# Patient Record
Sex: Male | Born: 1985 | Race: White | Hispanic: No | Marital: Single | Smoking: Current every day smoker
Health system: Southern US, Community
[De-identification: ages and names within clinical notes are randomized; demographics above are authoritative.]

## PROBLEM LIST (undated history)

## (undated) DIAGNOSIS — K219 Gastro-esophageal reflux disease without esophagitis: Secondary | ICD-10-CM

## (undated) DIAGNOSIS — F419 Anxiety disorder, unspecified: Secondary | ICD-10-CM

## (undated) HISTORY — PX: FOREARM SURGERY: SHX651

---

## 2008-02-15 ENCOUNTER — Emergency Department: Payer: Self-pay | Admitting: Emergency Medicine

## 2008-02-20 ENCOUNTER — Emergency Department: Payer: Self-pay | Admitting: Internal Medicine

## 2009-07-03 IMAGING — CR DG RIBS 2V*R*
1 series · 3 of 3 positions shown · non-contrast
Comparison: none

REASON FOR EXAM: fall/injury    Minor care
COMMENTS:   LMP: (Male)

PROCEDURE:     DXR - DXR RIBS RIGHT UNILATERAL  - February 20, 2008 [DATE]
RESULT:     No fracture or other acute bony abnormality is identified.

[Series 1: view not recorded · 0.17mm/px · 3 of 3 slices shown]
[im 1/3]
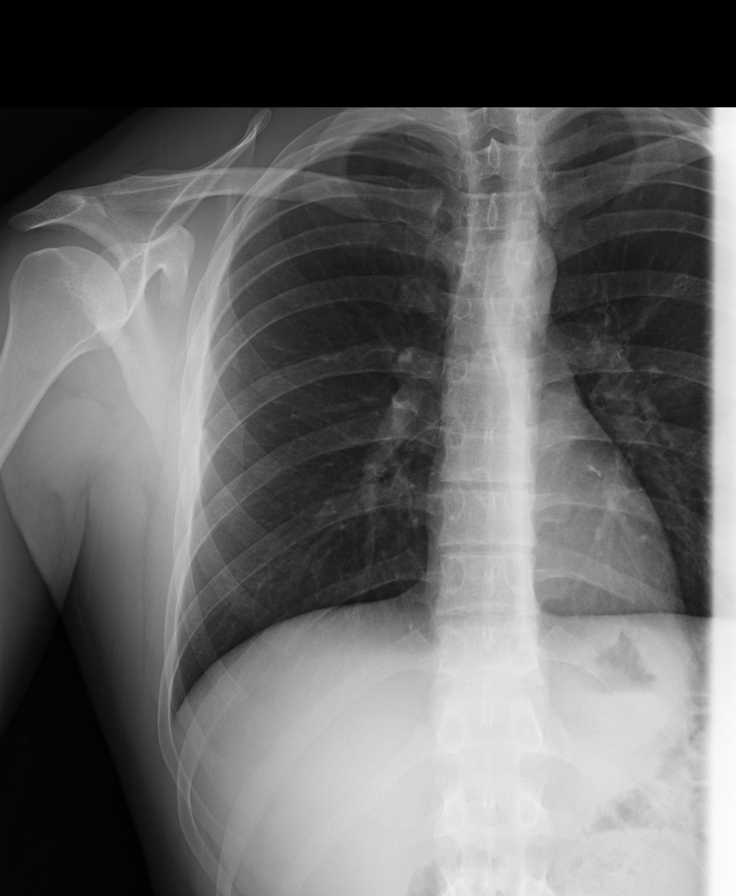
[im 2/3]
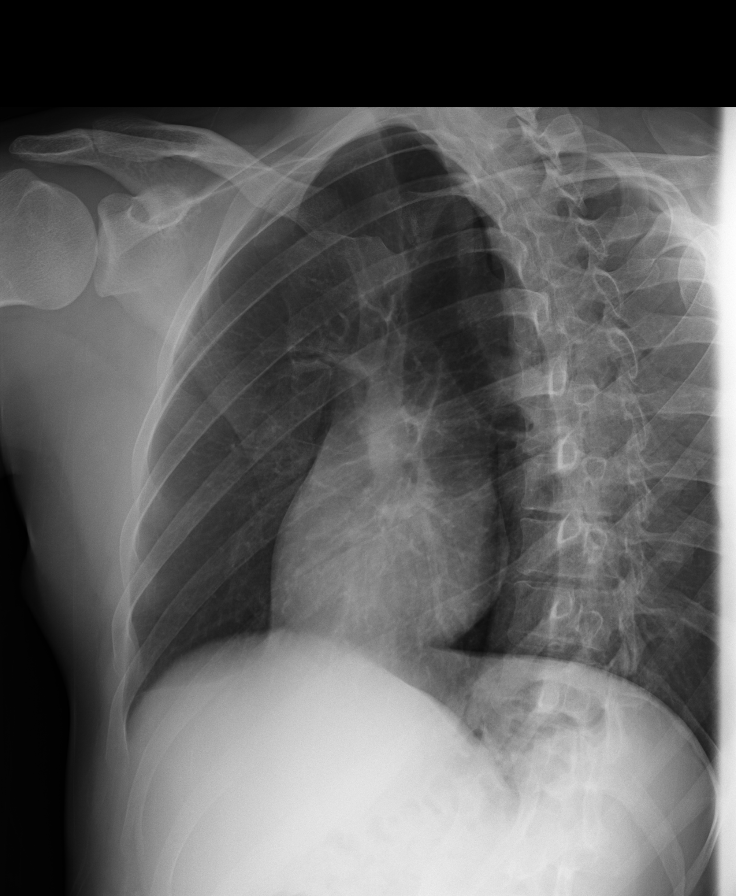
[im 3/3]
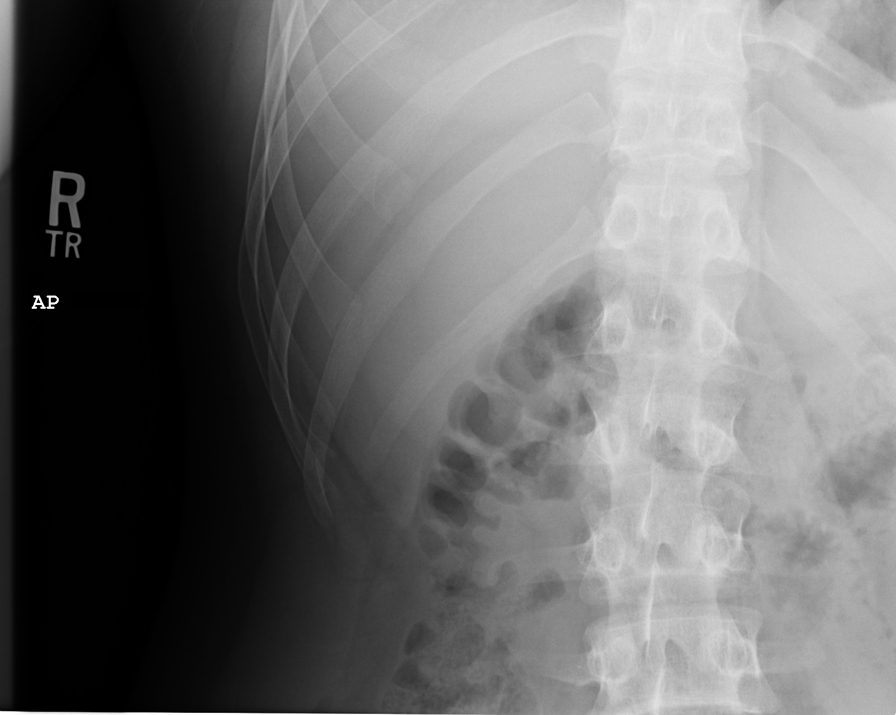

[3 of 3 positions shown; findings below may reference images not displayed]

IMPRESSION: No significant osseous abnormalities are noted.

## 2010-07-11 ENCOUNTER — Emergency Department: Payer: Self-pay | Admitting: Emergency Medicine

## 2012-10-04 ENCOUNTER — Ambulatory Visit: Payer: Self-pay | Admitting: Internal Medicine

## 2012-10-06 LAB — BETA STREP CULTURE(ARMC)

## 2012-11-20 ENCOUNTER — Ambulatory Visit: Payer: Self-pay

## 2012-11-23 ENCOUNTER — Emergency Department: Payer: Self-pay | Admitting: Emergency Medicine

## 2014-06-25 ENCOUNTER — Ambulatory Visit: Payer: Self-pay | Admitting: Emergency Medicine

## 2018-03-13 ENCOUNTER — Other Ambulatory Visit: Payer: Self-pay

## 2018-03-13 ENCOUNTER — Ambulatory Visit
Admission: EM | Admit: 2018-03-13 | Discharge: 2018-03-13 | Disposition: A | Discharge status: Home / Self Care | Payer: BLUE CROSS/BLUE SHIELD | Attending: Family Medicine | Admitting: Family Medicine

## 2018-03-13 DIAGNOSIS — L0501 Pilonidal cyst with abscess: Secondary | ICD-10-CM

## 2018-03-13 MED ORDER — SULFAMETHOXAZOLE-TRIMETHOPRIM 800-160 MG PO TABS: 1.0000 | ORAL_TABLET | Freq: Two times a day (BID) | ORAL | 0 refills | Status: AC

## 2018-03-13 NOTE — ED Provider Notes (Signed)
[facility] ____________________________________________  Time seen: Approximately 3:01 PM  I have reviewed the triage vital signs and the nursing notes.   HISTORY  Chief Complaint Abscess   HPI Johnny Bright is a 32 y.o. 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  Reports of the last 2 weeks she has been feeling tenderness to the area again.  Reports does have some noted scar tissue.  States that the area is mildly tender and somewhat swollen currently.  Denies any drainage.  Denies pain radiation.  Continues to urinate and move bowels normally.  Denies fevers.  No abdominal pain.  States has an appointment in 1 month with surgery for consultation of removal.  Denies other aggravating or alleviating factors. Denies recent sickness. Denies recent antibiotic use.   [facility] [place] [facility]: PCP   History reviewed. No pertinent past medical history.  There are no active problems to display for this patient.   Past Surgical History:  Procedure Laterality Date  . FOREARM SURGERY       No current facility-administered medications for this encounter.   Current Outpatient Medications:  .  citalopram (CELEXA) 20 MG tablet, Take by mouth., Disp: , Rfl:  .  famotidine (PEPCID) 10 MG tablet, Take by mouth., Disp: , Rfl:  .  sulfamethoxazole-trimethoprim (BACTRIM DS,SEPTRA DS) 800-160 MG tablet, Take 1 tablet by mouth 2 (two) times daily for 10 days., Disp: 20 tablet, Rfl: 0  Allergies Patient has no known allergies.  Family History  Problem Relation Age of Onset  . Ovarian cancer Mother   . Hypertension Mother     Social History Social History   Tobacco Use  . Smoking status: Former Games developer  . Smokeless tobacco: Never Used  Substance Use Topics  . Alcohol use: Yes    Comment: occasionally  . Drug use: Not Currently    Review of  Systems Constitutional: No fever/chills Eyes: No visual changes. ENT: No sore throat. Cardiovascular: Denies chest pain. Respiratory: Denies shortness of breath. Gastrointestinal: No abdominal pain.  No nausea, no vomiting.  No diarrhea.  No constipation. Genitourinary: Negative for dysuria. Musculoskeletal: Negative for back pain. Skin: as above. Denies other skin changes. Denies history of MRSA.  ____________________________________________   PHYSICAL EXAM:  VITAL SIGNS: ED Triage Vitals  Enc Vitals Group     BP 03/13/18 1231 108/67     Pulse Rate 03/13/18 1231 68     Resp 03/13/18 1231 18     Temp 03/13/18 1231 98.4 F (36.9 C)     Temp Source 03/13/18 1231 Oral     SpO2 03/13/18 1231 100 %     Weight 03/13/18 1228 155 lb (70.3 kg)     Height 03/13/18 1228 5\' 8"  (1.727 m)     Head Circumference --      Peak Flow --      Pain Score 03/13/18 1228 8     Pain Loc --      Pain Edu? --      Excl. in GC? --     Constitutional: Alert and oriented. Well appearing and in no acute distress. ENT      Head: Normocephalic and atraumatic. Cardiovascular: Normal rate, regular rhythm. Grossly normal heart sounds.  Good peripheral circulation. Respiratory: Normal respiratory effort without tachypnea nor retractions. Breath sounds are clear and equal bilaterally. No wheezes, rales, rhonchi. Musculoskeletal: No lumbar tenderness to palpation.  Neurologic:  Normal speech  and language. Speech is normal. No gait instability.  Skin:  Skin is warm, dry.  Except: Left upper pilonidal abscess noted, previous scar present, approximately 1 x 1.5 cm indurated tender area, nonerythematous, no fluctuance, no drainage, no surrounding erythema, mild tenderness to direct palpation. Psychiatric: Mood and affect are normal. Speech and behavior are normal. Patient exhibits appropriate insight and judgment   ___________________________________________   LABS (all labs ordered are listed, but only  abnormal results are displayed)  Labs Reviewed - No data to display   PROCEDURES Procedures   INITIAL IMPRESSION / ASSESSMENT AND PLAN / ED COURSE  Pertinent labs & imaging results that were available during my care of the patient were reviewed by me and considered in my medical decision making (see chart for details).  Well-appearing patient.  No acute distress.  Patient with a recurrent pilonidal abscess history.  Area is still relatively small without fluctuance.  No clear indication for I&D at this time.  Will treat with oral Bactrim, recommend warm compresses and supportive care, keeping clean.  Follow-up with surgeon as scheduled.Discussed indication, risks and benefits of medications with patient.  Discussed follow up with Primary care physician this week as needed. Discussed follow up and return parameters including no resolution or any worsening concerns. Patient verbalized understanding and agreed to plan.   ____________________________________________   FINAL CLINICAL IMPRESSION(S) / ED DIAGNOSES  Final diagnoses:  Pilonidal abscess     ED Discharge Orders        Ordered    sulfamethoxazole-trimethoprim (BACTRIM DS,SEPTRA DS) 800-160 MG tablet  2 times daily     03/13/18 1346       Note: This dictation was prepared with Dragon dictation along with smaller phrase technology. Any transcriptional errors that result from this process are unintentional.         Renford DillsMiller, Gotti Alwin, NP 03/13/18 1516

## 2018-03-13 NOTE — ED Triage Notes (Signed)
Patient complains of pilonidal cyst that has been flaring off and on for 10 years. Patient states that he is set to have it removed next month. Patient states that area is full of pressure.

## 2018-03-13 NOTE — Discharge Instructions (Addendum)
Take medication as prescribed. Rest. Drink plenty of fluids. Warm compresses.   Keep surgery appointment.   Follow up with your primary care physician this week as needed. Return to Urgent care for new or worsening concerns.

## 2018-10-16 ENCOUNTER — Ambulatory Visit
Admission: EM | Admit: 2018-10-16 | Discharge: 2018-10-16 | Disposition: A | Discharge status: Home / Self Care | Payer: BLUE CROSS/BLUE SHIELD | Attending: Family Medicine | Admitting: Family Medicine

## 2018-10-16 ENCOUNTER — Other Ambulatory Visit: Payer: Self-pay

## 2018-10-16 ENCOUNTER — Encounter: Payer: Self-pay | Admitting: Emergency Medicine

## 2018-10-16 DIAGNOSIS — Z720 Tobacco use: Secondary | ICD-10-CM | POA: Diagnosis not present

## 2018-10-16 DIAGNOSIS — R69 Illness, unspecified: Principal | ICD-10-CM

## 2018-10-16 DIAGNOSIS — R05 Cough: Secondary | ICD-10-CM | POA: Diagnosis not present

## 2018-10-16 DIAGNOSIS — R0981 Nasal congestion: Secondary | ICD-10-CM

## 2018-10-16 DIAGNOSIS — J111 Influenza due to unidentified influenza virus with other respiratory manifestations: Secondary | ICD-10-CM

## 2018-10-16 DIAGNOSIS — J0100 Acute maxillary sinusitis, unspecified: Secondary | ICD-10-CM

## 2018-10-16 HISTORY — DX: Gastro-esophageal reflux disease without esophagitis: K21.9

## 2018-10-16 HISTORY — DX: Anxiety disorder, unspecified: F41.9

## 2018-10-16 LAB — RAPID INFLUENZA A&B ANTIGENS
Influenza A (ARMC): NEGATIVE
Influenza B (ARMC): NEGATIVE

## 2018-10-16 MED ORDER — AMOXICILLIN-POT CLAVULANATE 875-125 MG PO TABS: 1.0000 | ORAL_TABLET | Freq: Two times a day (BID) | ORAL | 0 refills | Status: DC

## 2018-10-16 NOTE — ED Triage Notes (Signed)
Patient in today c/o cough, body aches, nasal congestion, sweats and fever(100-101.5). Symptoms started 1 week ago, but got really bad over the weekend. Patient has tried OTC Sudafed.

## 2018-10-16 NOTE — ED Provider Notes (Signed)
[facility] ____________________________________________  Time seen: Approximately 11:10 AM  I have reviewed the triage vital signs and the nursing notes.  HISTORY  Chief Complaint Generalized Body Aches and Cough  HPI Johnny Bright is a 33 y.o. male present for evaluation of cough, congestion, chills, body aches and fever.  Patient states that for approximately 1 week he has had runny nose, nasal congestion and intermittent sinus pressure.  States that this is not uncommon for him as he is frequently in and out of dusty areas and underneath crawl spaces of houses.  States that over the last 3 days however he has had chills, body aches, fevers accompanying the other symptoms.  Denies sore throat.  Has continued to overall eat and drink well.  States fever yesterday was 101.5.  States feels like his fever broke today.  Has been taken over-the-counter Sudafed but not taken Tylenol and ibuprofen.  Reports his wife was just positive for influenza this past week.  Denies chest pain or shortness of breath.  No recent antibiotic use.  Was otherwise doing well.  [facility] [place] [facility]: PCP   Past Medical History:  Diagnosis Date  . Anxiety   . GERD (gastroesophageal reflux disease)     There are no active problems to display for this patient.   Past Surgical History:  Procedure Laterality Date  . FOREARM SURGERY       No current facility-administered medications for this encounter.   Current Outpatient Medications:  .  citalopram (CELEXA) 20 MG tablet, Take by mouth., Disp: , Rfl:  .  famotidine (PEPCID) 10 MG tablet, Take by mouth., Disp: , Rfl:  .  amoxicillin-clavulanate (AUGMENTIN) 875-125 MG tablet, Take 1 tablet by mouth every 12 (twelve) hours., Disp: 20 tablet, Rfl: 0  Allergies Patient has no known allergies.  Family History  Problem Relation Age of Onset  . Ovarian cancer Mother   . Hypertension Mother   . Other Father        unknown medical history      Social History Social History   Tobacco Use  . Smoking status: Current Every Day Smoker    Packs/day: 0.50  . Smokeless tobacco: Never Used  Substance Use Topics  . Alcohol use: Yes    Comment: occasionally  . Drug use: Not Currently    Review of Systems Constitutional: positive fever. ENT: No sore throat. As above.  Cardiovascular: Denies chest pain. Respiratory: Denies shortness of breath. Gastrointestinal: No abdominal pain.  Musculoskeletal: Negative for back pain. Skin: Negative for rash.   ____________________________________________   PHYSICAL EXAM:  VITAL SIGNS: ED Triage Vitals  Enc Vitals Group     BP 10/16/18 1022 126/83     Pulse Rate 10/16/18 1022 (!) 106     Resp 10/16/18 1022 16     Temp 10/16/18 1022 98.2 F (36.8 C)     Temp Source 10/16/18 1022 Oral     SpO2 10/16/18 1022 100 %     Weight 10/16/18 1023 150 lb (68 kg)     Height 10/16/18 1023 5\' 9"  (1.753 m)     Head Circumference --      Peak Flow --      Pain Score 10/16/18 1022 6     Pain Loc --      Pain Edu? --      Excl. in GC? --     Constitutional: Alert and oriented. Well appearing and in no acute distress. Eyes: Conjunctivae are normal. Head: Atraumatic.Mild  to moderate tenderness to palpation bilateral maxillary sinuses. No frontal sinus tenderness. No swelling. No erythema.   Ears: no erythema, normal TMs bilaterally.   Nose: nasal congestion with bilateral nasal turbinate erythema and edema.   Mouth/Throat: Mucous membranes are moist. Oropharynx non-erythematous.No tonsillar swelling or exudate.  Neck: No stridor.  No cervical spine tenderness to palpation. Hematological/Lymphatic/Immunilogical: No cervical lymphadenopathy. Cardiovascular: Normal rate, regular rhythm. Grossly normal heart sounds.  Good peripheral circulation. Respiratory: Normal respiratory effort.  No retractions. No wheezes, rales or rhonchi. Good air movement.  Musculoskeletal:No cervical, thoracic or  lumbar tenderness to palpation.  Neurologic:  Normal speech and language. No gait instability. Skin:  Skin is warm, dry and intact. No rash noted. Psychiatric: Mood and affect are normal. Speech and behavior are normal.  ___________________________________________   LABS (all labs ordered are listed, but only abnormal results are displayed)  Labs Reviewed  RAPID INFLUENZA A&B ANTIGENS ([facility] ONLY)   ____________________________________________ PROCEDURES Procedures   INITIAL IMPRESSION / ASSESSMENT AND PLAN / ED COURSE  Pertinent labs & imaging results that were available during my care of the patient were reviewed by me and considered in my medical decision making (see chart for details).  Well appearing, no acute distress. Influenza negative, however suspect influenza.  Suspect current sinusitis.  Discussed timeframe of Tamiflu, question of patient and timeframe, patient declined.  Will treat with oral Augmentin.  Encourage over-the-counter Tylenol, ibuprofen, Sudafed as needed, rest, fluids and supportive care.  Work note given. Discussed indication, risks and benefits of medications with patient.  Discussed follow up with Primary care physician this week. Discussed follow up and return parameters including no resolution or any worsening concerns. Patient verbalized understanding and agreed to plan.   ____________________________________________   FINAL CLINICAL IMPRESSION(S) / ED DIAGNOSES  Final diagnoses:  Influenza-like illness  Acute maxillary sinusitis, recurrence not specified     ED Discharge Orders         Ordered    amoxicillin-clavulanate (AUGMENTIN) 875-125 MG tablet  Every 12 hours     10/16/18 1111           Note: This dictation was prepared with Dragon dictation along with smaller phrase technology. Any transcriptional errors that result from this process are unintentional.         Renford Dills, NP 10/16/18 1328

## 2018-10-16 NOTE — Discharge Instructions (Addendum)
Take medication as prescribed. Rest. Drink plenty of fluids.  The counter Tylenol and ibuprofen as needed, over-the-counter medication as discussed.  Follow up with your primary care physician this week as needed. Return to Urgent care for new or worsening concerns.

## 2019-06-05 ENCOUNTER — Ambulatory Visit
Admission: EM | Admit: 2019-06-05 | Discharge: 2019-06-05 | Disposition: A | Discharge status: Home / Self Care | Payer: BLUE CROSS/BLUE SHIELD | Attending: Urgent Care | Admitting: Urgent Care

## 2019-06-05 ENCOUNTER — Other Ambulatory Visit: Payer: Self-pay

## 2019-06-05 ENCOUNTER — Ambulatory Visit (INDEPENDENT_AMBULATORY_CARE_PROVIDER_SITE_OTHER): Payer: BLUE CROSS/BLUE SHIELD

## 2019-06-05 ENCOUNTER — Encounter: Payer: Self-pay | Admitting: Emergency Medicine

## 2019-06-05 DIAGNOSIS — Z7189 Other specified counseling: Secondary | ICD-10-CM

## 2019-06-05 DIAGNOSIS — Z20822 Contact with and (suspected) exposure to covid-19: Secondary | ICD-10-CM

## 2019-06-05 DIAGNOSIS — Z20828 Contact with and (suspected) exposure to other viral communicable diseases: Secondary | ICD-10-CM

## 2019-06-05 DIAGNOSIS — J019 Acute sinusitis, unspecified: Secondary | ICD-10-CM | POA: Diagnosis not present

## 2019-06-05 DIAGNOSIS — R05 Cough: Secondary | ICD-10-CM

## 2019-06-05 DIAGNOSIS — B9689 Other specified bacterial agents as the cause of diseases classified elsewhere: Secondary | ICD-10-CM | POA: Diagnosis not present

## 2019-06-05 MED ORDER — ALBUTEROL SULFATE HFA 108 (90 BASE) MCG/ACT IN AERS: 1.0000 | INHALATION_SPRAY | Freq: Four times a day (QID) | RESPIRATORY_TRACT | 0 refills | Status: AC | PRN

## 2019-06-05 MED ORDER — AMOXICILLIN-POT CLAVULANATE 875-125 MG PO TABS: 1.0000 | ORAL_TABLET | Freq: Two times a day (BID) | ORAL | 0 refills | Status: DC

## 2019-06-05 MED ORDER — PSEUDOEPH-BROMPHEN-DM 30-2-10 MG/5ML PO SYRP: 5.0000 mL | ORAL_SOLUTION | Freq: Four times a day (QID) | ORAL | 0 refills | Status: DC | PRN

## 2019-06-05 NOTE — ED Triage Notes (Signed)
Pt c/o sinus congestion, pressure, pain and yellow discharge. Also c/o cough, dizziness, shortness of breath. Started about 3 days ago. Denies fever.

## 2019-06-05 NOTE — ED Provider Notes (Signed)
[place]   Name: Johnny Bright DOB: 12-19-1985 MRN: [id number] CSN: [id number] PCP: Patient, No Pcp Per  Arrival date and time:  06/05/19 0930  Chief Complaint:  Sinus Problem and Dizziness   NOTE: Prior to seeing the patient today, I have reviewed the triage nursing documentation and vital signs. Clinical staff has updated patient's PMH/PSHx, current medication list, and drug allergies/intolerances to ensure comprehensive history available to assist in medical decision making.   History:   HPI: Johnny Bright is a 33 y.o. male who presents today with complaints of cough and congestion for 3-4 days. Cough has been productive of yellow sputum with associated mild shortness of breath. He denies any significant fevers; temperatures running in the 99 range; (+) chills. Patient notes that he has been dizzy since the inset of his symptoms. He has a mild sore throat related to the nasal drainage. Patient has been fatigued and having mild diffuse myalgias. Appetite has been decreased. He has experienced some minor nausea and a few diarrhea episodes. Patient denies being in close contact with anyone known to be ill. He has never been tested for SARS-CoV-2 (novel coronavirus) per his report. In efforts to conservatively manage his symptoms at home, the patient notes that he has used Dayquil, Nyquil, fluticasone, and guaifenesin, which have not helped to improve his symptoms. PMH (+) for recurrent sinus infections 2-3 times per year.   Past Medical History:  Diagnosis Date  . Anxiety   . GERD (gastroesophageal reflux disease)     Past Surgical History:  Procedure Laterality Date  . FOREARM SURGERY      Family History  Problem Relation Age of Onset  . Ovarian cancer Mother   . Hypertension Mother   . Other Father        unknown medical history    Social History   Tobacco Use  . Smoking status: Current Every Day Smoker    Packs/day: 0.50  . Smokeless tobacco: Never Used   Substance Use Topics  . Alcohol use: Yes    Comment: occasionally  . Drug use: Not Currently    There are no active problems to display for this patient.   Home Medications:    Current Meds  Medication Sig  . citalopram (CELEXA) 20 MG tablet Take by mouth.  . famotidine (PEPCID) 10 MG tablet Take by mouth.    Allergies:   Patient has no known allergies.  Review of Systems (ROS): Review of Systems  Constitutional: Positive for appetite change (decreased) and fatigue. Negative for fever.  HENT: Positive for congestion, postnasal drip, sinus pressure, sinus pain and sore throat. Negative for ear pain, sneezing and trouble swallowing.   Eyes: Negative for pain, discharge and redness.  Respiratory: Negative for cough, chest tightness and shortness of breath.   Cardiovascular: Negative for chest pain and palpitations.  Gastrointestinal: Positive for diarrhea and nausea. Negative for abdominal pain and vomiting.  Musculoskeletal: Positive for myalgias. Negative for arthralgias, back pain and neck pain.  Skin: Negative for color change, pallor and rash.  Neurological: Positive for dizziness. Negative for syncope, weakness and headaches.  Hematological: Negative for adenopathy.     Vital Signs: Today's Vitals   06/05/19 0953 06/05/19 0957 06/05/19 1104  BP:  128/85   Pulse:  60   Resp:  18   Temp:  98 F (36.7 C)   TempSrc:  Oral   SpO2:  99%   Weight: 175 lb (79.4 kg)    Height: 5\' 9"  (  1.753 m)    PainSc: 0-No pain  0-No pain    Physical Exam: Physical Exam  Constitutional: He is oriented to person, place, and time and well-developed, well-nourished, and in no distress. No distress.  HENT:  Head: Normocephalic and atraumatic.  Right Ear: No tenderness. Tympanic membrane is not injected and not bulging. A middle ear effusion (mild serous) is present.  Left Ear: No tenderness. Tympanic membrane is not injected and not bulging. A middle ear effusion (mild serous) is  present.  Nose: Mucosal edema and sinus tenderness present.  Mouth/Throat: Uvula is midline and mucous membranes are normal. Posterior oropharyngeal erythema present. No posterior oropharyngeal edema.  Eyes: Pupils are equal, round, and reactive to light. Conjunctivae and EOM are normal.  Neck: Normal range of motion. Neck supple. No tracheal deviation present.  Cardiovascular: Normal rate, regular rhythm, normal heart sounds and intact distal pulses. Exam reveals no gallop and no friction rub.  No murmur heard. Pulmonary/Chest: Effort normal. No accessory muscle usage. No respiratory distress. He has wheezes (scattered expiratory). He has rhonchi (diffusly scattered; clears somewhat with cough).  Abdominal: Soft. Normal appearance and bowel sounds are normal. He exhibits no distension. There is no abdominal tenderness.  Musculoskeletal: Normal range of motion.  Lymphadenopathy:    He has no cervical adenopathy.  Neurological: He is alert and oriented to person, place, and time. Gait normal.  Skin: Skin is warm and dry. No rash noted. He is not diaphoretic.  Psychiatric: Mood, memory, affect and judgment normal.  Nursing note and vitals reviewed.   Urgent Care Treatments / Results:   LABS: PLEASE NOTE: all labs that were ordered this encounter are listed, however only abnormal results are displayed. Labs Reviewed  NOVEL CORONAVIRUS, NAA (HOSP ORDER, SEND-OUT TO REF LAB; TAT 18-24 HRS)    EKG: -None  RADIOLOGY: Dg Chest 2 View  Result Date: 06/05/2019 CLINICAL DATA:  33 year old male with history of cough and congestion. Shortness of breath. EXAM: CHEST - 2 VIEW COMPARISON:  Chest x-ray 10/04/2012. FINDINGS: Lung volumes are normal. No consolidative airspace disease. No pleural effusions. No pneumothorax. No pulmonary nodule or mass noted. Pulmonary vasculature and the cardiomediastinal silhouette are within normal limits. IMPRESSION: No radiographic evidence of acute cardiopulmonary  disease. Electronically Signed   By: Trudie Reedaniel  Entrikin M.D.   On: 06/05/2019 10:47    PROCEDURES: Procedures  MEDICATIONS RECEIVED THIS VISIT: Medications - No data to display  PERTINENT CLINICAL COURSE NOTES/UPDATES:   Initial Impression / Assessment and Plan / Urgent Care Course:  Pertinent labs & imaging results that were available during my care of the patient were personally reviewed by me and considered in my medical decision making (see lab/imaging section of note for values and interpretations).  Renaldo ReelGlenn Anthony Peeler is a 33 y.o. male [facility] with complaints of Sinus Problem and Dizziness   Patient overall well appearing and in no acute distress today in clinic. Presenting symptoms (see HPI) and exam as documented above. He presents with symptoms associated with SARS-CoV-2 (novel coronavirus). Discussed typical symptom constellation. Reviewed potential for infection and need for testing. Patient amenable to being tested. SARS-CoV-2 swab collected by certified clinical staff. Discussed variable turn around times associated with testing, as [facility] are being [facility], and have been taking between 2-5 days to come back. He was advised to self quarantine, per [facility] guidelines, until negative results received.   Radiographs of the chest performed today revealed no acute cardiopulmonary process;  no evidence of peribronchial thickening, areas of consolidation, or focal infiltrates. Suspect sinusitis. Patient has failed treatment with several conservative measures at home (decongestatns, mucolytics, and nasal sprays). PMH (+) for recurrent sinusitis at least 2-3 times per year. He is wheezing. Will proceed with treatment using a 10 day course of Augmentin for his developing infection. Will also give patient Bromfed for his cough and an albuterol MDI for his SOB and wheezing. Discussed supportive care measures at home during acute phase of illness. Patient  to rest as much as possible. He was encouraged to ensure adequate hydration (water and ORS) to prevent dehydration and electrolyte derangements. Patient may use APAP and/or IBU on an as needed basis for pain/fever.   Current clinical condition warrants patient being out of work in order to quarantine while waiting for testing results He was provided with the appropriate documentation to provide to his place of employment that will allow for him to RTW on 06/07/2019 with no restrictions. RTW is contingent on his SARS-CoV-2 test results being reviewed as negative.    Discussed follow up with primary care physician in 1 week for re-evaluation. I have reviewed the follow up and strict return precautions for any new or worsening symptoms. Patient is aware of symptoms that would be deemed urgent/emergent, and would thus require further evaluation either here or in the emergency department. At the time of discharge, he verbalized understanding and consent with the discharge plan as it was reviewed with him. All questions were fielded by provider and/or clinic staff prior to patient discharge.    Final Clinical Impressions / Urgent Care Diagnoses:   Final diagnoses:  Acute bacterial rhinosinusitis  Encounter for laboratory testing for COVID-19 virus  Advice given about COVID-19 virus infection    New Prescriptions:  [place] Controlled Substance Registry consulted? Not Applicable  Meds ordered this encounter  Medications  . amoxicillin-clavulanate (AUGMENTIN) 875-125 MG tablet    Sig: Take 1 tablet by mouth every 12 (twelve) hours.    Dispense:  20 tablet    Refill:  0  . albuterol (VENTOLIN HFA) 108 (90 Base) MCG/ACT inhaler    Sig: Inhale 1-2 puffs into the lungs every 6 (six) hours as needed for wheezing or shortness of breath.    Dispense:  18 g    Refill:  0  . brompheniramine-pseudoephedrine-DM 30-2-10 MG/5ML syrup    Sig: Take 5 mLs by mouth 4 (four) times daily as needed.    Dispense:  118 mL     Refill:  0    Recommended Follow up Care:  Patient encouraged to follow up with the following provider within the specified time frame, or sooner as dictated by the severity of his symptoms. As always, he was instructed that for any urgent/emergent care needs, he should seek care either here or in the emergency department for more immediate evaluation.  Follow-up Information    PCP In 1 week.         NOTE: This note was prepared using Lobbyist along with smaller Company secretary. Despite my best ability to proofread, there is the potential that transcriptional errors may still occur from this process, and are completely unintentional.    Karen Kitchens, NP 06/05/19 1107

## 2019-06-05 NOTE — Discharge Instructions (Addendum)
It was very nice seeing you today in clinic. Thank you for entrusting me with your care.   Rest and Increase fluid intake as much as possible. Water is always best, as sugar and caffeine containing fluids can cause you to become dehydrated. Try to incorporate electrolyte enriched fluids, such as Gatorade or Pedialyte, into your daily fluid intake.  Please utilize the medications that we discussed. Your prescriptions have been called in to your pharmacy.   You were tested for SARS-CoV-2 (novel coronavirus) today. Results have been taking 2-3 days to come back. Please self quarantine at home until negative results have been received.   Make arrangements to follow up with your regular doctor in 1 week for re-evaluation if not improving. If your symptoms/condition worsens, please seek follow up care either here or in the ER. Please remember, our [place] providers are "right here with you" when you need [place].   Again, it was my pleasure to take care of you today. Thank you for choosing our clinic. I hope that you start to feel better quickly.   Honor Loh, MSN, APRN, FNP-C, CEN Advanced Practice Provider [facility]

## 2019-06-06 LAB — NOVEL CORONAVIRUS, NAA (HOSP ORDER, SEND-OUT TO REF LAB; TAT 18-24 HRS): SARS-CoV-2, NAA: NOT DETECTED

## 2019-09-24 ENCOUNTER — Ambulatory Visit: Payer: Self-pay | Attending: Family

## 2019-09-24 ENCOUNTER — Other Ambulatory Visit: Payer: Self-pay

## 2019-09-24 DIAGNOSIS — Z20822 Contact with and (suspected) exposure to covid-19: Secondary | ICD-10-CM | POA: Insufficient documentation

## 2019-09-25 LAB — NOVEL CORONAVIRUS, NAA: SARS-CoV-2, NAA: NOT DETECTED

## 2019-10-29 ENCOUNTER — Other Ambulatory Visit: Payer: Self-pay

## 2019-10-29 ENCOUNTER — Encounter: Payer: Self-pay | Admitting: Emergency Medicine

## 2019-10-29 ENCOUNTER — Ambulatory Visit
Admission: EM | Admit: 2019-10-29 | Discharge: 2019-10-29 | Disposition: A | Discharge status: Home / Self Care | Payer: Self-pay

## 2019-10-29 DIAGNOSIS — R197 Diarrhea, unspecified: Secondary | ICD-10-CM

## 2019-10-29 DIAGNOSIS — Z20822 Contact with and (suspected) exposure to covid-19: Secondary | ICD-10-CM

## 2019-10-29 DIAGNOSIS — J328 Other chronic sinusitis: Secondary | ICD-10-CM

## 2019-10-29 DIAGNOSIS — F17210 Nicotine dependence, cigarettes, uncomplicated: Secondary | ICD-10-CM

## 2019-10-29 NOTE — ED Provider Notes (Signed)
[facility]    CSN: [id number] Arrival date & time: 10/29/19  1042      History   Chief Complaint Chief Complaint  Patient presents with  . Abdominal Pain  . Nasal Congestion    HPI Johnny Bright is a 34 y.o. male. who presents with diarrhea since this am, and bloating and buerping malodorus matter. Had tuna for dinner last night from a package, but does not recall it tasting bad or smelling bad. His wife does not have any symptoms.  No blood present in the tool. Has not taken any meds today. His sister in law was exposed to positive covid relative.  Has chronic sinus problems for several months. The last time he was on antibiotics was 05/2019   Past Medical History:  Diagnosis Date  . Anxiety   . GERD (gastroesophageal reflux disease)     There are no problems to display for this patient.   Past Surgical History:  Procedure Laterality Date  . FOREARM SURGERY         Home Medications    Prior to Admission medications   Medication Sig Start Date End Date Taking? Authorizing Provider  buPROPion [facility] SR) 150 MG 12 hr tablet  07/27/19  Yes [provider]  citalopram (CELEXA) 20 MG tablet Take by mouth. 12/09/17  Yes [provider]  omeprazole (PRILOSEC) 20 MG capsule Take 20 mg by mouth daily.   Yes [provider]  albuterol (VENTOLIN HFA) 108 (90 Base) MCG/ACT inhaler Inhale 1-2 puffs into the lungs every 6 (six) hours as needed for wheezing or shortness of breath. 06/05/19   Karen Kitchens, NP  famotidine (PEPCID) 10 MG tablet Take by mouth.  10/29/19  [provider]    Family History Family History  Problem Relation Age of Onset  . Ovarian cancer Mother   . Hypertension Mother   . Other Father        unknown medical history    Social History Social History   Tobacco Use  . Smoking status: Current Every Day Smoker    Packs/day: 0.50  . Smokeless tobacco: Never Used  Substance Use Topics  .  Alcohol use: Yes    Comment: occasionally  . Drug use: Not Currently     Allergies   Patient has no known allergies.   Review of Systems Review of Systems  Constitutional: Positive for chills and fatigue. Negative for appetite change, diaphoresis and fever.  HENT: Positive for congestion. Negative for ear pain, postnasal drip, sore throat, trouble swallowing and voice change.   Eyes: Negative for discharge.  Respiratory: Positive for cough and wheezing. Negative for chest tightness and shortness of breath.        He coughed so hard this am it caused him to vomit  Cardiovascular: Negative for chest pain.  Gastrointestinal: Positive for abdominal distention, abdominal pain, diarrhea and vomiting. Negative for blood in stool and nausea.       Has not vomited but once this am  Genitourinary: Negative for difficulty urinating.  Musculoskeletal: Positive for arthralgias and myalgias. Negative for gait problem and joint swelling.  Skin: Negative for rash.  Neurological: Negative for dizziness, weakness and headaches.  Hematological: Negative for adenopathy.     Physical Exam Triage Vital Signs ED Triage Vitals  Enc Vitals Group     BP 10/29/19 1128 123/79     Pulse Rate 10/29/19 1128 83     Resp 10/29/19 1128 18  Temp 10/29/19 1128 98.6 F (37 C)     Temp Source 10/29/19 1128 Oral     SpO2 10/29/19 1128 98 %     Weight 10/29/19 1122 184 lb (83.5 kg)     Height 10/29/19 1122 5\' 8"  (1.727 m)     Head Circumference --      Peak Flow --      Pain Score 10/29/19 1122 6     Pain Loc --      Pain Edu? --      Excl. in GC? --    No data found.  Updated Vital Signs BP 123/79 (BP Location: Left Arm)   Pulse 83   Temp 98.6 F (37 C) (Oral)   Resp 18   Ht 5\' 8"  (1.727 m)   Wt 184 lb (83.5 kg)   SpO2 98%   BMI 27.98 kg/m   Visual Acuity Right Eye Distance:   Left Eye Distance:   Bilateral Distance:    Right Eye Near:   Left Eye Near:    Bilateral Near:      Physical Exam Vitals and nursing note reviewed.  Constitutional:      General: He is not in acute distress.    Appearance: He is not toxic-appearing.  HENT:     Head: Normocephalic.  Cardiovascular:     Rate and Rhythm: Normal rate and regular rhythm.     Heart sounds: No murmur.  Pulmonary:     Effort: Pulmonary effort is normal. No respiratory distress.     Breath sounds: Normal breath sounds. No wheezing, rhonchi or rales.  Abdominal:     General: Abdomen is flat. Bowel sounds are normal. There is no distension.     Palpations: Abdomen is soft. There is no hepatomegaly, splenomegaly or mass.     Tenderness: There is generalized abdominal tenderness.     Comments: Has mild generalized soreness  Skin:    General: Skin is warm and dry.     Findings: No rash.  Neurological:     Mental Status: He is alert and oriented to person, place, and time.  Psychiatric:        Mood and Affect: Mood normal.        Behavior: Behavior normal.      UC Treatments / Results  Labs (all labs ordered are listed, but only abnormal results are displayed) Labs Reviewed  SARS CORONAVIRUS 2 (TAT 6-24 HRS)    EKG   Radiology No results found.  Procedures Procedures (including critical care time)  Medications Ordered in UC Medications - No data to display  Initial Impression / Assessment and Plan / UC Course  I have reviewed the triage vital signs and the nursing notes. I tried doing the nasopharyngeal swab but pt could not tolerated. The superficial COVID swab was done instead by the nurse. Placed on BRAT diet, see instructions.  Final Clinical Impressions(s) / UC Diagnoses   Final diagnoses:  Diarrhea, unspecified type  Exposure to COVID-19 virus  Other chronic sinusitis     Discharge Instructions     Stay quarentined until you get your COVID results Stay on BRAT diet( bananas, rice,apple sauce, toast) and bone broth for 2-5 days until you get better and while having  diarrhea. Avoid dairy since this will make the diarrhea worse. You may take Pepto for diarrhea as needed.  Take the following supplements to help your immune system be stronger to fight this viral infection Take Quarcetin 500 mg three times  a day x 7 days with Zinc 50 mg ones a day x 7 days. The quarcetin is an antiviral and anti-inflammatory supplement which helps open the zinc channels in the cell to absorb Zinc. Zinc helps decrease the virus load in your body.  Also make sure to take Vit D 5,000 IU per day with a fatty meal and Vit C 1000 mg a day until you are completely better. Stay on Vitamin D 2,000 the rest of the season.   FOR YOUR SINUSES UNTIL THE SPECIALIST VISIT TRY THIS Watch this video who to do saline nose rinses.  [email or web address]   Do not use tap water, only boiled water that has been cooled down.  Do it twice a day  for 5-7 days, but avoid bed time.       ED Prescriptions    None     PDMP not reviewed this encounter.   Garey Ham, PA-C 10/29/19 1226

## 2019-10-29 NOTE — Discharge Instructions (Addendum)
Stay quarentined until you get your COVID results Stay on BRAT diet( bananas, rice,apple sauce, toast) and bone broth for 2-5 days until you get better and while having diarrhea. Avoid dairy since this will make the diarrhea worse. You may take Pepto for diarrhea as needed.  Take the following supplements to help your immune system be stronger to fight this viral infection Take Quarcetin 500 mg three times a day x 7 days with Zinc 50 mg ones a day x 7 days. The quarcetin is an antiviral and anti-inflammatory supplement which helps open the zinc channels in the cell to absorb Zinc. Zinc helps decrease the virus load in your body.  Also make sure to take Vit D 5,000 IU per day with a fatty meal and Vit C 1000 mg a day until you are completely better. Stay on Vitamin D 2,000 the rest of the season.   FOR YOUR SINUSES UNTIL THE SPECIALIST VISIT TRY THIS Watch this video who to do saline nose rinses.  [email or web address]   Do not use tap water, only boiled water that has been cooled down.  Do it twice a day  for 5-7 days, but avoid bed time.

## 2019-10-29 NOTE — ED Triage Notes (Signed)
Pt c/o generalized abdominal pain, diarrhea, and nasal congestion. Abdominal pain and diarrhea started this morning. He states he is burping and feels very bloated.

## 2019-10-30 LAB — NOVEL CORONAVIRUS, NAA (HOSP ORDER, SEND-OUT TO REF LAB; TAT 18-24 HRS): SARS-CoV-2, NAA: NOT DETECTED

## 2020-10-16 IMAGING — CR DG CHEST 2V
2 series · 3 of 3 positions shown · non-contrast
Comparison: Chest x-ray 10/04/2012.

CLINICAL DATA: 32-year-old male with history of cough and
congestion. Shortness of breath.

EXAM:
CHEST - 2 VIEW

[Series 1: chest pa · 0.14mm/px · 2 of 2 slices shown]
[im 1/2]
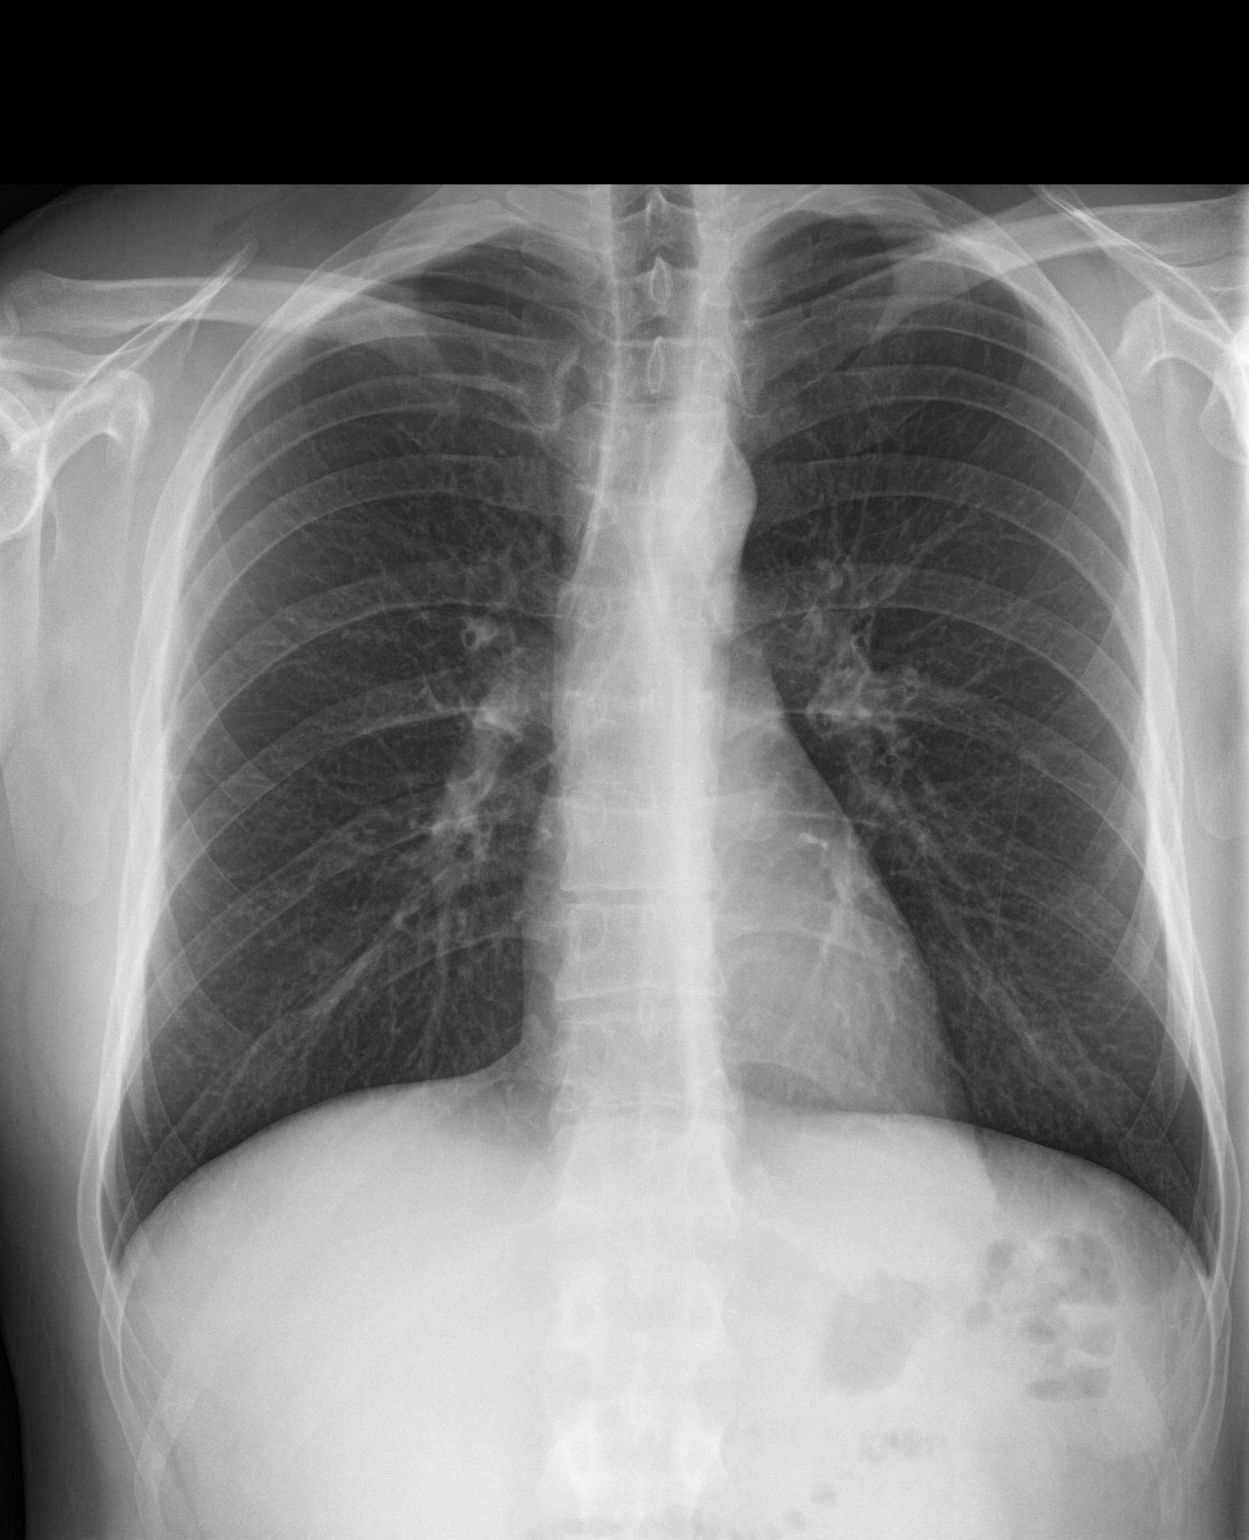
[im 2/2]
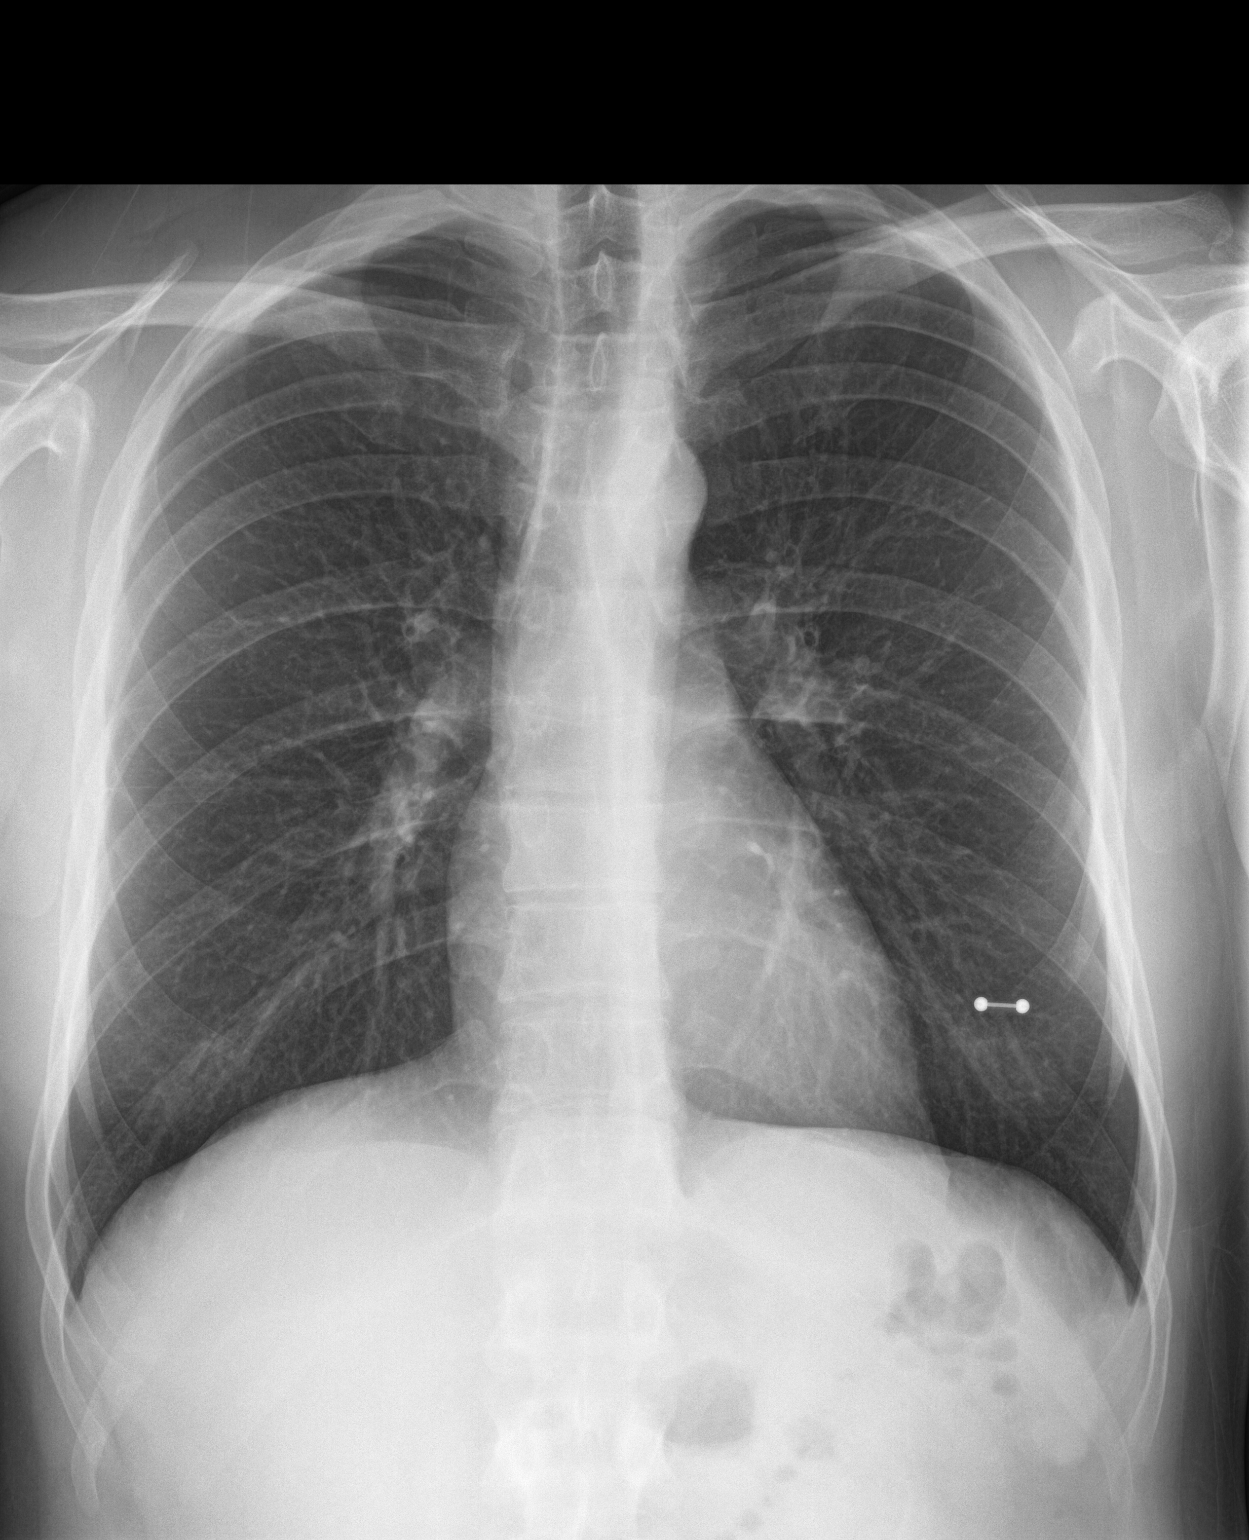

[chest lat]
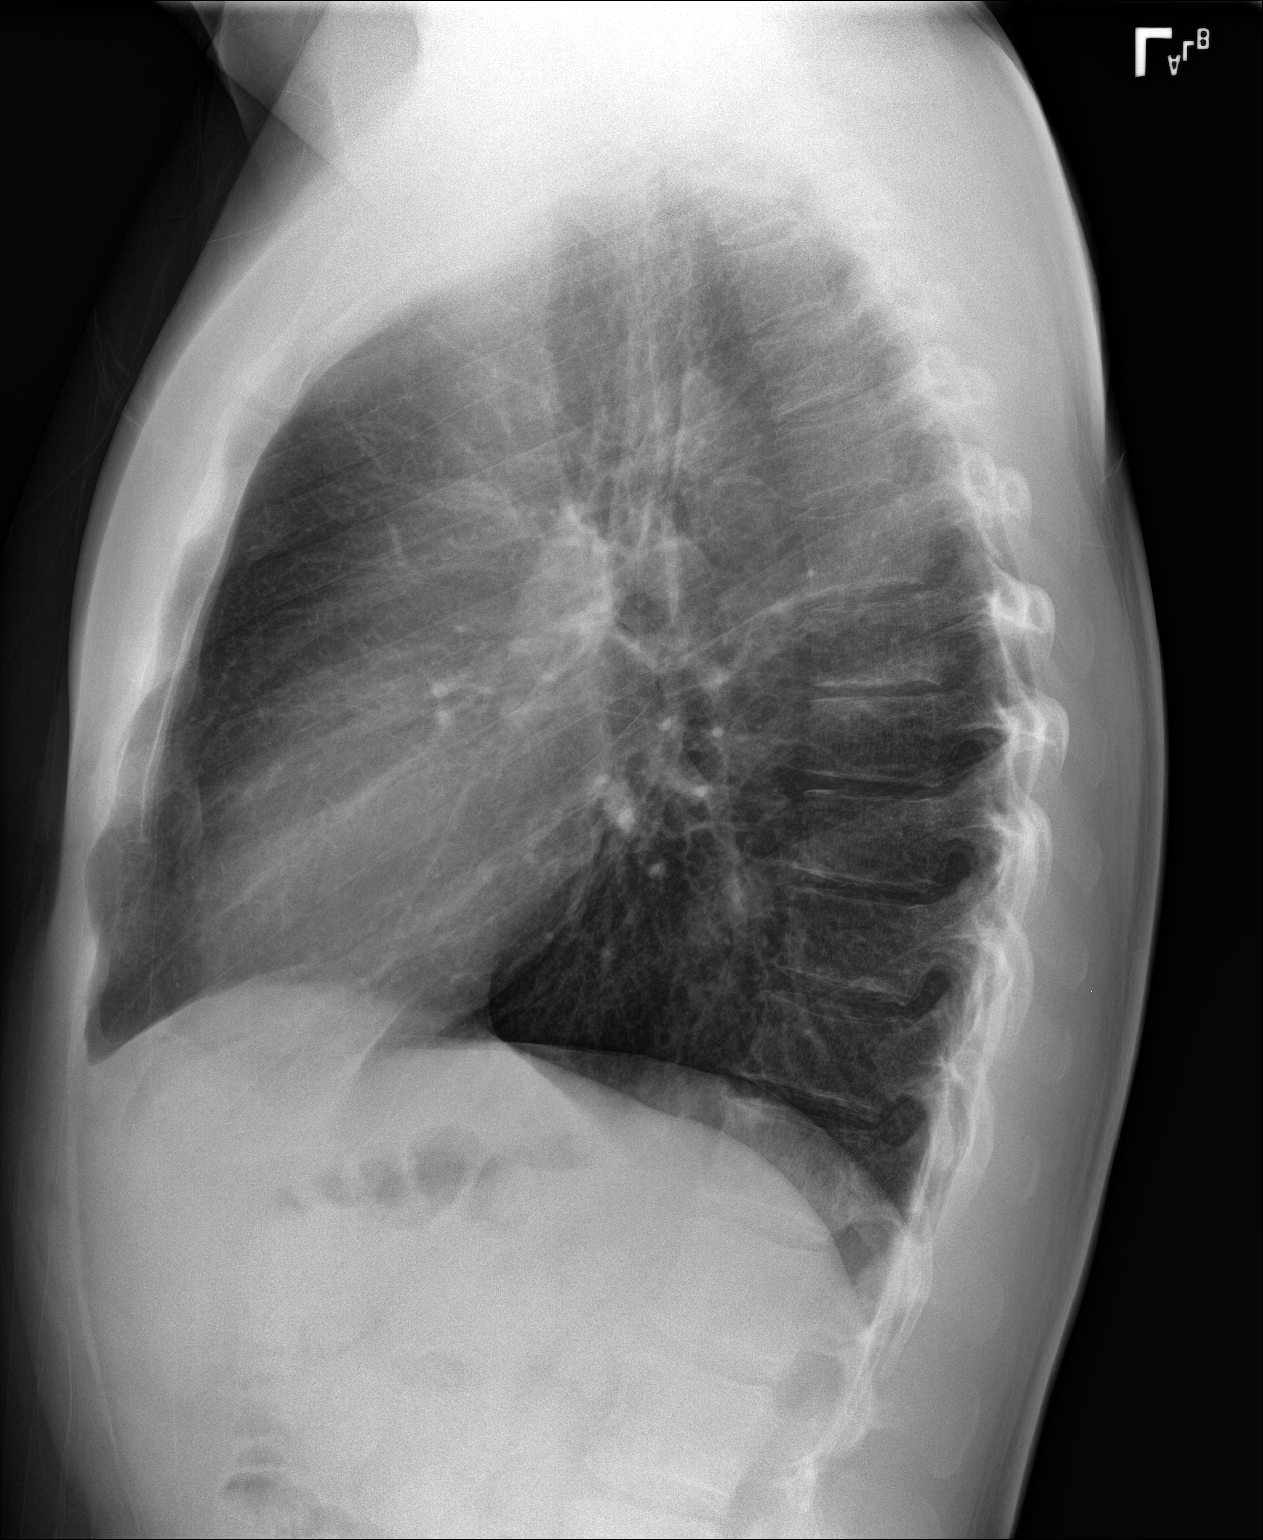

[3 of 3 positions shown; findings below may reference images not displayed]

FINDINGS: Lung volumes are normal. No consolidative airspace disease. No
pleural effusions. No pneumothorax. No pulmonary nodule or mass
noted. Pulmonary vasculature and the cardiomediastinal silhouette
are within normal limits.
IMPRESSION: No radiographic evidence of acute cardiopulmonary disease.
